# Patient Record
Sex: Male | Born: 1960 | Race: White | Hispanic: No | Marital: Married | Smoking: Never smoker
Health system: Southern US, Community
[De-identification: ages and names within clinical notes are randomized; demographics above are authoritative.]

## PROBLEM LIST (undated history)

## (undated) DIAGNOSIS — N200 Calculus of kidney: Secondary | ICD-10-CM

## (undated) HISTORY — PX: LITHOTRIPSY: SUR834

## (undated) HISTORY — PX: CYSTOSCOPY/RETROGRADE/URETEROSCOPY/STONE EXTRACTION WITH BASKET: SHX5317

---

## 2007-08-01 ENCOUNTER — Emergency Department (HOSPITAL_COMMUNITY): Admission: EM | Admit: 2007-08-01 | Discharge: 2007-08-01 | Payer: Self-pay | Admitting: Emergency Medicine

## 2007-08-05 ENCOUNTER — Ambulatory Visit: Payer: Self-pay | Admitting: Urology

## 2008-07-22 ENCOUNTER — Emergency Department: Payer: Self-pay | Admitting: Emergency Medicine

## 2011-10-06 LAB — POCT URINALYSIS DIP (DEVICE)
Bilirubin Urine: NEGATIVE
Glucose, UA: NEGATIVE
Nitrite: NEGATIVE
Operator id: 235561
Specific Gravity, Urine: >=1.03

## 2011-10-06 LAB — URINE CULTURE: Colony Count: 1000

## 2014-09-05 ENCOUNTER — Emergency Department (INDEPENDENT_AMBULATORY_CARE_PROVIDER_SITE_OTHER)
Admission: EM | Admit: 2014-09-05 | Discharge: 2014-09-05 | Disposition: A | Discharge status: Home / Self Care | Payer: Commercial Managed Care - PPO | Source: Home / Self Care | Attending: Family Medicine | Admitting: Family Medicine

## 2014-09-05 ENCOUNTER — Encounter (HOSPITAL_COMMUNITY): Payer: Self-pay | Admitting: Emergency Medicine

## 2014-09-05 DIAGNOSIS — N200 Calculus of kidney: Secondary | ICD-10-CM

## 2014-09-05 DIAGNOSIS — R7989 Other specified abnormal findings of blood chemistry: Secondary | ICD-10-CM

## 2014-09-05 DIAGNOSIS — R799 Abnormal finding of blood chemistry, unspecified: Secondary | ICD-10-CM

## 2014-09-05 HISTORY — DX: Calculus of kidney: N20.0

## 2014-09-05 LAB — POCT I-STAT, CHEM 8
BUN: 16 mg/dL (ref 6–23)
CHLORIDE: 106 meq/L (ref 96–112)
Calcium, Ion: 1.36 mmol/L — ABNORMAL HIGH (ref 1.12–1.23)
Creatinine, Ser: 1.4 mg/dL — ABNORMAL HIGH (ref 0.50–1.35)
Glucose, Bld: 145 mg/dL — ABNORMAL HIGH (ref 70–99)
HCT: 49 % (ref 39.0–52.0)
Hemoglobin: 16.7 g/dL (ref 13.0–17.0)
Potassium: 4.9 mEq/L (ref 3.7–5.3)
SODIUM: 140 meq/L (ref 137–147)
TCO2: 27 mmol/L (ref 0–100)

## 2014-09-05 LAB — POCT URINALYSIS DIP (DEVICE)
BILIRUBIN URINE: NEGATIVE
Glucose, UA: NEGATIVE mg/dL
KETONES UR: NEGATIVE mg/dL
LEUKOCYTES UA: NEGATIVE
Nitrite: NEGATIVE
PH: 5 (ref 5.0–8.0)
Protein, ur: NEGATIVE mg/dL
Specific Gravity, Urine: >=1.03 (ref 1.005–1.030)
Urobilinogen, UA: 0.2 mg/dL (ref 0.0–1.0)

## 2014-09-05 MED ORDER — HYDROMORPHONE HCL 1 MG/ML IJ SOLN
2.0000 mg | Freq: Once | INTRAMUSCULAR | Status: AC
  Administered 2014-09-05: 2 mg via INTRAMUSCULAR

## 2014-09-05 MED ORDER — ONDANSETRON 4 MG PO TBDP
ORAL_TABLET | ORAL | Status: AC
  Filled 2014-09-05: qty 1

## 2014-09-05 MED ORDER — KETOROLAC TROMETHAMINE 60 MG/2ML IM SOLN
INTRAMUSCULAR | Status: AC
  Filled 2014-09-05: qty 2

## 2014-09-05 MED ORDER — IBUPROFEN 800 MG PO TABS: 800.0000 mg | ORAL_TABLET | Freq: Three times a day (TID) | ORAL | Status: DC

## 2014-09-05 MED ORDER — OXYCODONE-ACETAMINOPHEN 5-325 MG PO TABS: 1.0000 | ORAL_TABLET | ORAL | Status: DC | PRN

## 2014-09-05 MED ORDER — HYDROMORPHONE HCL PF 1 MG/ML IJ SOLN
INTRAMUSCULAR | Status: AC
  Filled 2014-09-05: qty 2

## 2014-09-05 MED ORDER — KETOROLAC TROMETHAMINE 60 MG/2ML IM SOLN
60.0000 mg | Freq: Once | INTRAMUSCULAR | Status: AC
  Administered 2014-09-05: 60 mg via INTRAMUSCULAR

## 2014-09-05 MED ORDER — TAMSULOSIN HCL 0.4 MG PO CAPS: 0.4000 mg | ORAL_CAPSULE | Freq: Every day | ORAL | Status: DC

## 2014-09-05 MED ORDER — ONDANSETRON 4 MG PO TBDP
4.0000 mg | ORAL_TABLET | Freq: Once | ORAL | Status: AC
  Administered 2014-09-05: 4 mg via ORAL

## 2014-09-05 NOTE — Discharge Instructions (Signed)

## 2014-09-05 NOTE — ED Provider Notes (Signed)
CSN: [id number]     Arrival date & time 09/05/14  0945 History   None    Chief Complaint  Patient presents with  . Flank Pain   (Consider location/radiation/quality/duration/timing/severity/associated sxs/prior Treatment) HPI Comments: 53 year old male presents complaining of probable kidney stone. He has a history of kidney stones 5 years ago, and he underwent lithotripsy and passed multiple stones over the next few weeks, and has not had any issues since then. He is still under the care of a urologist, he had a vasectomy couple of years ago. The pain is in his left flank and radiates into his left testicle. It began acutely at 6:00 this morning. The pain is severe and comes in waves, 10 out of 10 in severity. He tried drinking a beer and taking Flomax, that has not helped. he has not had any vomiting. No pain medications taken. He is requesting something for pain at this time. He had gross hematuria this morning as well.   Past Medical History  Diagnosis Date  . Kidney stone    History reviewed. No pertinent past surgical history. History reviewed. No pertinent family history. History  Substance Use Topics  . Smoking status: Never Smoker   . Smokeless tobacco: Not on file  . Alcohol Use: Yes    Review of Systems  Constitutional: Negative for fever and chills.  Gastrointestinal: Negative for nausea, vomiting and abdominal pain.  Genitourinary: Positive for hematuria, flank pain and testicular pain. Negative for discharge and penile pain.  All other systems reviewed and are negative.   Allergies  Review of patient's allergies indicates no known allergies.  Home Medications   Prior to Admission medications   Medication Sig Start Date End Date Taking? Authorizing Provider  ibuprofen (ADVIL,MOTRIN) 800 MG tablet Take 1 tablet (800 mg total) by mouth 3 (three) times daily. 09/05/14   Graylon Good, PA-C  oxyCODONE-acetaminophen (PERCOCET/ROXICET) 5-325 MG per tablet Take 1-2  tablets by mouth every 4 (four) hours as needed for moderate pain or severe pain. 09/05/14   Graylon Good, PA-C  tamsulosin (FLOMAX) 0.4 MG CAPS capsule Take 1 capsule (0.4 mg total) by mouth daily. 09/05/14   Adrian Blackwater Kaleigha Chamberlin, PA-C   BP 152/90  Pulse 78  Temp(Src) 98.6 F (37 C) (Oral)  Resp 18  SpO2 100% Physical Exam  Nursing note and vitals reviewed. Constitutional: He is oriented to person, place, and time. He appears well-developed and well-nourished. He appears distressed (in obvious pain ).  HENT:  Head: Normocephalic.  Pulmonary/Chest: Effort normal. No respiratory distress.  Abdominal: He exhibits no distension and no mass. There is no tenderness. There is CVA tenderness (left). There is no rebound and no guarding.  Neurological: He is alert and oriented to person, place, and time. Coordination normal.  Skin: Skin is warm and dry. No rash noted. He is not diaphoretic.  Psychiatric: He has a normal mood and affect. Judgment normal.    ED Course  Procedures (including critical care time) Labs Review Labs Reviewed  POCT URINALYSIS DIP (DEVICE) - Abnormal; Notable for the following:    Hgb urine dipstick LARGE (*)    All other components within normal limits  POCT I-STAT, CHEM 8 - Abnormal; Notable for the following:    Creatinine, Ser 1.40 (*)    Glucose, Bld 145 (*)    Calcium, Ion 1.36 (*)    All other components within normal limits    Imaging Review No results found.   MDM   1.  Nephrolithiasis   2. Creatinine elevation    Gross hematuria and classic story for kidney stones a patient with a history of kidney stones. Complete symptomatically relief with Toradol and Dilaudid. His mild elevation in serum creatinine is concerning, I have called his urologist office, they have requested faxed records and they will see this patient in the next day or 2. He will go to the emergency department if worsening.   Meds ordered this encounter  Medications  . ketorolac  (TORADOL) injection 60 mg    Sig:   . ondansetron (ZOFRAN-ODT) disintegrating tablet 4 mg    Sig:   . HYDROmorphone (DILAUDID) injection 2 mg    Sig:   . oxyCODONE-acetaminophen (PERCOCET/ROXICET) 5-325 MG per tablet    Sig: Take 1-2 tablets by mouth every 4 (four) hours as needed for moderate pain or severe pain.    Dispense:  20 tablet    Refill:  0    Order Specific Question:  Supervising Provider    Answer:  Linna Hoff [phone]  . tamsulosin (FLOMAX) 0.4 MG CAPS capsule    Sig: Take 1 capsule (0.4 mg total) by mouth daily.    Dispense:  10 capsule    Refill:  0    Order Specific Question:  Supervising Provider    Answer:  Linna Hoff [phone]  . ibuprofen (ADVIL,MOTRIN) 800 MG tablet    Sig: Take 1 tablet (800 mg total) by mouth 3 (three) times daily.    Dispense:  30 tablet    Refill:  0    Order Specific Question:  Supervising Provider    Answer:  Bradd Canary D [[id number]]      Graylon Good, PA-C 09/05/14 1050

## 2014-09-05 NOTE — ED Notes (Signed)
Pt reports  l  Flank  Pain    With  Pain  Radiating  Downward     With some  Nausea   As  Well     Symptoms  Started  This am       History of kidney  Stones  In  Past

## 2014-09-08 NOTE — ED Provider Notes (Signed)
Medical screening examination/treatment/procedure(s) were performed by resident physician or non-physician practitioner and as supervising physician I was immediately available for consultation/collaboration.   Nikya Busler DOUGLAS MD.   Deven Furia D Malick Netz, MD 09/08/14 1643 

## 2014-09-12 ENCOUNTER — Ambulatory Visit: Payer: Self-pay | Admitting: Urology

## 2015-04-14 NOTE — Op Note (Signed)
PATIENT NAME:  Jared Bell, Jared Bell MR#:  [id number] DATE OF BIRTH:  1961/10/06  DATE OF PROCEDURE:  09/12/2014  PREOPERATIVE DIAGNOSIS: Left ureterolithiasis.   POSTOPERATIVE DIAGNOSIS: Left ureterolithiasis.   PROCEDURES:  1.  Left ureteroscopic ureterolithotomy with holmium laser lithotripsy.  2.  Fluoroscopy.   SURGEON: Anola GurneyMichael Roger Kettles, MD.   ANESTHETIST:  Dr. Darleene CleaverVan Staveren.    ANESTHETIC METHOD: General.   INDICATIONS: See the dictated physical. After informed consent the patient requests the above procedure.   OPERATIVE SUMMARY: After adequate general anesthesia had been obtained the patient was placed into dorsal lithotomy position and the perineum was prepped and draped in the usual fashion. Fluoroscopy confirmed presence of a 5 mm distal left ureteral stone. At this point the 7521 French cystoscope was coupled with the camera and then visually advanced into the bladder. The bladder was thoroughly inspected. No bladder tumors were identified. A 0.035 Glidewire was then advanced up the left ureter beyond the stone and curled into the renal pelvis. A 7 mm balloon dilating catheter was placed over the guidewire and the intramural ureter was dilated. The dilating catheter was then deflated and removed. Guidewire was left in place. The Stortz mini ureteroscope was then coupled to the camera and advanced into the distal left ureter. The stone was encountered. The 365 micron holmium laser fiber was then advanced through the scope and the stone was engaged. The stone was then pulverized into fragments less than 0.5 mm. At this point the ureteroscope and guidewire were removed. 10 mL of viscous Xylocaine was instilled within the urethra and the bladder. A B and O suppository was placed. The procedure was then terminated and the patient was transferred to the recovery room in stable condition.     ____________________________ Suszanne ConnersMichael R. Evelene CroonWolff, MD [email or web address] D: 09/12/2014 15:02:07 ET T: 09/12/2014 19:01:31  ET JOB#: [id number]  cc: Suszanne ConnersMichael R. Evelene CroonWolff, MD, <Dictator> Orson ApeMICHAEL R Tamaiya Bump MD ELECTRONICALLY SIGNED 09/13/2014 8:48

## 2018-09-26 ENCOUNTER — Ambulatory Visit (INDEPENDENT_AMBULATORY_CARE_PROVIDER_SITE_OTHER): Payer: Commercial Managed Care - PPO

## 2018-09-26 ENCOUNTER — Ambulatory Visit (HOSPITAL_COMMUNITY)
Admission: EM | Admit: 2018-09-26 | Discharge: 2018-09-26 | Disposition: A | Discharge status: Home / Self Care | Payer: Commercial Managed Care - PPO | Attending: Family Medicine | Admitting: Family Medicine

## 2018-09-26 ENCOUNTER — Encounter (HOSPITAL_COMMUNITY): Payer: Self-pay | Admitting: Emergency Medicine

## 2018-09-26 DIAGNOSIS — S51812A Laceration without foreign body of left forearm, initial encounter: Secondary | ICD-10-CM | POA: Diagnosis not present

## 2018-09-26 DIAGNOSIS — W293XXA Contact with powered garden and outdoor hand tools and machinery, initial encounter: Secondary | ICD-10-CM

## 2018-09-26 DIAGNOSIS — S51811A Laceration without foreign body of right forearm, initial encounter: Secondary | ICD-10-CM

## 2018-09-26 MED ORDER — TETANUS-DIPHTH-ACELL PERTUSSIS 5-2.5-18.5 LF-MCG/0.5 IM SUSP
0.5000 mL | Freq: Once | INTRAMUSCULAR | Status: AC
  Administered 2018-09-26: 0.5 mL via INTRAMUSCULAR

## 2018-09-26 MED ORDER — TETANUS-DIPHTH-ACELL PERTUSSIS 5-2.5-18.5 LF-MCG/0.5 IM SUSP
INTRAMUSCULAR | Status: AC
  Filled 2018-09-26: qty 0.5

## 2018-09-26 MED ORDER — OXYCODONE-ACETAMINOPHEN 5-325 MG PO TABS: 1.0000 | ORAL_TABLET | ORAL | 0 refills | Status: DC | PRN

## 2018-09-26 MED ORDER — HYDROMORPHONE HCL 1 MG/ML IJ SOLN
INTRAMUSCULAR | Status: AC
  Filled 2018-09-26: qty 0.5

## 2018-09-26 MED ORDER — HYDROMORPHONE HCL 1 MG/ML IJ SOLN
0.5000 mg | Freq: Once | INTRAMUSCULAR | Status: AC
  Administered 2018-09-26: 0.5 mg via SUBCUTANEOUS

## 2018-09-26 MED ORDER — DOXYCYCLINE HYCLATE 100 MG PO CAPS: 100.0000 mg | ORAL_CAPSULE | Freq: Two times a day (BID) | ORAL | 0 refills | Status: DC

## 2018-09-26 MED ORDER — OXYCODONE-ACETAMINOPHEN 5-325 MG PO TABS: 1.0000 | ORAL_TABLET | Freq: Four times a day (QID) | ORAL | 0 refills | Status: DC | PRN

## 2018-09-26 NOTE — Discharge Instructions (Addendum)
Start antibiotic therapy with doxycycline 100 mg twice daily within the next 24 hours. I have prescribed you Percocet 1 tablet every 6 hours as needed for pain. Return for care here in 5 days for a follow-up wound evaluation.  And return in 14 days to have sutures removed.  You may schedule an appointment online to avoid a wait.  Your x-ray was negative of any foreign bodies or fractures.  Go to the ER if you experience any numbness, tingling, diminished sensation or weakness of the right arm.  Schedule a new patient appointment I have listed contact information for Primary care at [facility] tomorrow 09/27/2018.

## 2018-09-26 NOTE — ED Notes (Signed)
Pt complains of numbness in his fingers. Kim NP at bedside. Assessed patient and states everything is okay and the patient is okay to go.

## 2018-09-26 NOTE — ED Triage Notes (Signed)
Pt with laceration to left forearm with chainsaw; CMS intact and bleeding controlled

## 2018-09-26 NOTE — ED Provider Notes (Signed)
[facility]    CSN: [id number] Arrival date & time: 09/26/18  1455     History   Chief Complaint Chief Complaint  Patient presents with  . Laceration    HPI Jared Bell is a 57 y.o. male.   HPI  Patient presents today approximately 1 hour after suffering a laceration after inadvertently dropping his chainsaw on his right arm. He was attempting to trim tree limbs and when this incident occurred. He reports minimal blood loss and denies numbness, tingling, or loss of sensation in his right arm or fingers.  He came immediately to urgent care for evaluation. He is not taking aspirin or blood thinners. He is accompanied by family today. He reports 10/10 pain. He is alert, oriented, and ambulatory. Past Medical History:  Diagnosis Date  . Kidney stone     There are no active problems to display for this patient.  History reviewed. No pertinent surgical history.  Home Medications    Prior to Admission medications   Medication Sig Start Date End Date Taking? Authorizing Provider  ibuprofen (ADVIL,MOTRIN) 800 MG tablet Take 1 tablet (800 mg total) by mouth 3 (three) times daily. 09/05/14   Graylon Good, PA-C  oxyCODONE-acetaminophen (PERCOCET/ROXICET) 5-325 MG per tablet Take 1-2 tablets by mouth every 4 (four) hours as needed for moderate pain or severe pain. Patient not taking: Reported on 09/26/2018 09/05/14   Graylon Good, PA-C  tamsulosin (FLOMAX) 0.4 MG CAPS capsule Take 1 capsule (0.4 mg total) by mouth daily. 09/05/14   Graylon Good, PA-C    Family History History reviewed. No pertinent family history.  Social History Social History   Tobacco Use  . Smoking status: Never Smoker  Substance Use Topics  . Alcohol use: Yes  . Drug use: Not on file     Allergies   Patient has no known allergies.   Review of Systems Review of Systems Pertinent negatives listed in HPI Physical Exam Triage Vital Signs ED Triage Vitals [09/26/18 1515]    Enc Vitals Group     BP (!) 161/92     Pulse Rate 72     Resp 18     Temp 98.5 F (36.9 C)     Temp Source Oral     SpO2 99 %     Weight      Height      Head Circumference      Peak Flow      Pain Score 10     Pain Loc      Pain Edu?      Excl. in GC?    No data found.  Updated Vital Signs BP (!) 161/92 (BP Location: Right Arm)   Pulse 72   Temp 98.5 F (36.9 C) (Oral)   Resp 18   SpO2 99%   Visual Acuity Right Eye Distance:   Left Eye Distance:   Bilateral Distance:    Right Eye Near:   Left Eye Near:    Bilateral Near:     Physical Exam  Constitutional: He is oriented to person, place, and time.  Cardiovascular: Normal rate.  Pulmonary/Chest: Effort normal.  Neurological: He is alert and oriented to person, place, and time. No sensory deficit. Coordination normal.  Bilateral hand grips 5/5  Skin: Skin is warm. Capillary refill takes less than 2 seconds.              UC Treatments / Results  Labs (all labs ordered are listed,  but only abnormal results are displayed) Labs Reviewed - No data to display  EKG None  Radiology Dg Forearm Left  Result Date: 09/26/2018 CLINICAL DATA:  LEFT forearm laceration with a chainsaw today EXAM: LEFT FOREARM - 2 VIEW COMPARISON:  None FINDINGS: Dressing artifacts at ulnar and volar aspect of mid forearm. Osseous mineralization normal. Joint spaces preserved. No acute fracture, dislocation, or bone destruction. No radiopaque foreign bodies. IMPRESSION: No acute osseous abnormalities. Electronically Signed   By: Ulyses Southward M.D.   On: 09/26/2018 18:05    Procedures Laceration Repair Date/Time: 09/26/2018 7:22 PM Performed by: Bing Neighbors, FNP Authorized by: Bing Neighbors, FNP   Consent:    Consent obtained:  Verbal   Consent given by:  Patient   Risks discussed:  Poor cosmetic result, poor wound healing, vascular damage, retained foreign body and nerve damage   Alternatives discussed:   Referral Anesthesia (see MAR for exact dosages):    Anesthesia method:  Local infiltration   Local anesthetic:  Lidocaine 2% WITH epi Laceration details:    Location:  Shoulder/arm   Shoulder/arm location:  R lower arm Repair type:    Repair type:  Complex Pre-procedure details:    Preparation:  Patient was prepped and draped in usual sterile fashion and imaging obtained to evaluate for foreign bodies Treatment:    Area cleansed with:  Hibiclens   Amount of cleaning:  Extensive   Irrigation solution:  Sterile water   Irrigation method:  Pressure wash   Visualized foreign bodies/material removed: no   Skin repair:    Repair method:  Sutures   Suture size:  3-0   Suture material:  Prolene   Suture technique:  Simple interrupted   Number of sutures:  5 Approximation:    Approximation:  Loose Post-procedure details:    Dressing:  Bulky dressing   Patient tolerance of procedure:  Tolerated with difficulty Comments:     Patient experience numbness and tingling in distal digits of right hand. Arm was elevated on table for approximately 40 minutes form beginning to end of procedure. He maintains good capillary refill, sensation is intact, strength 5/5. Given option to go now to ER, opted to monitor for a short period longer and if symptoms do not resolve or worsen, will follow-up at the ER for further evaluation.   (including critical care time)  Medications Ordered in UC Medications  HYDROmorphone (DILAUDID) injection 0.5 mg (0.5 mg Subcutaneous Given 09/26/18 1618)    Initial Impression / Assessment and Plan / UC Course  I have reviewed the triage vital signs and the nursing notes.  Pertinent labs & imaging results that were available during my care of the patient were reviewed by me and considered in my medical decision making (see chart for details).  Patient presents today for laceration of the right upper anterior forearm in which she sustained earlier today while cutting  limbs with a chainsaw.  Chainsaw  made contact with his right upper forearm causing a laceration extending to the dermis layer of skin. He sustained minimal blood loss and was able to control pain prior to arrival to urgent care. Pain was managed with Dilaudid 0.5 mg x 1 IM.  Wound irrigated vigorously. Laceration was  not well approximated therefore laceration  closed using simple interrupted, suturing approach.  Patient was given anticipatory guidance that laceration will likely result in a permanent scar forearm.  Tdap vaccination given.  Imaging performed-negative for foreign body or fracture. Prescribed prophylactic antibiotic  to prevent infection and a short course of pain medication for moderate to severe pain.  Patient advised to follow-up here at urgent care in 5 days for wound check and return in 14 days to have sutures removed.  Patient and wife verbalized understanding and agreement with plan.    Post procedure: Patient experience numbness and tingling in distal digits of right hand. Arm was elevated on table for approximately 40 minutes form beginning to end of procedure. He maintains good capillary refill, sensation is intact, strength 5/5. Given option to go now to ER, opted to monitor for a short period longer and if symptoms do not resolve or worsen, will follow-up at the ER for further evaluation.  Final Clinical Impressions(s) / UC Diagnoses   Final diagnoses:  Laceration of right forearm, initial encounter     Discharge Instructions     Start antibiotic therapy with doxycycline 100 mg twice daily within the next 24 hours. I have prescribed you Percocet 1 tablet every 6 hours as needed for pain. Return for care here in 5 days for a follow-up wound evaluation.  And return in 14 days to have sutures removed.  You may schedule an appointment online to avoid a wait.  Your x-ray was negative of any foreign bodies or fractures.  Go to the ER if you experience any numbness, tingling,  diminished sensation or weakness of the right arm.  Schedule a new patient appointment I have listed contact information for Primary care at [facility] tomorrow 09/27/2018.    ED Prescriptions    Medication Sig Dispense Auth. Provider   oxyCODONE-acetaminophen (PERCOCET/ROXICET) 5-325 MG tablet  (Status: Discontinued) Take 1-2 tablets by mouth every 4 (four) hours as needed for moderate pain or severe pain. 20 tablet Bing Neighbors, FNP   doxycycline (VIBRAMYCIN) 100 MG capsule Take 1 capsule (100 mg total) by mouth 2 (two) times daily. 20 capsule Bing Neighbors, FNP   oxyCODONE-acetaminophen (PERCOCET/ROXICET) 5-325 MG tablet Take 1-2 tablets by mouth every 6 (six) hours as needed for moderate pain or severe pain. 20 tablet Bing Neighbors, FNP     Controlled Substance Prescriptions [place] Controlled Substance Registry consulted? Yes, I have consulted the [place] Controlled Substances Registry for this patient, and feel the risk/benefit ratio today is favorable for proceeding with this prescription for a controlled substance.   Bing Neighbors, [place] 09/28/18 [phone]

## 2018-09-27 ENCOUNTER — Telehealth: Payer: Self-pay | Admitting: Family Medicine

## 2018-09-27 NOTE — Telephone Encounter (Signed)
Contact patient to schedule follow-up 10/01/18 or 10/04/2018 for wound evaluation. Pt seen and would like to establish at [facility]

## 2018-10-01 ENCOUNTER — Encounter: Payer: Self-pay | Admitting: Family Medicine

## 2018-10-01 ENCOUNTER — Ambulatory Visit (INDEPENDENT_AMBULATORY_CARE_PROVIDER_SITE_OTHER): Payer: Commercial Managed Care - PPO | Admitting: Family Medicine

## 2018-10-01 VITALS — BP 149/83 | HR 61 | Temp 97.3°F | Resp 17 | Ht 73.0 in | Wt 227.2 lb

## 2018-10-01 DIAGNOSIS — Z5189 Encounter for other specified aftercare: Secondary | ICD-10-CM

## 2018-10-01 DIAGNOSIS — Z23 Encounter for immunization: Secondary | ICD-10-CM | POA: Diagnosis not present

## 2018-10-01 MED ORDER — CEFTRIAXONE SODIUM 500 MG IJ SOLR
500.0000 mg | Freq: Once | INTRAMUSCULAR | Status: AC
  Administered 2018-10-01: 500 mg via INTRAMUSCULAR

## 2018-10-01 NOTE — Progress Notes (Signed)
Patient ID: Natalio Salois, male    DOB: September 04, 1961, 56 y.o.   MRN: [id number]  PCP: Patient, No Pcp Per  Chief Complaint  Patient presents with  . Establish Care  . Suture / Staple Removal    has c/o about redness & puffiness around the sutures. no fever, chills    Subjective:  HPI Gianno Volner is a 57 y.o. male presents to establish care and wound follow-up.Patient was seen by me at [facility] on 09/26/2018. He underwent a left forearm laceration repair s/p accidental laceration caused by chainsaw while trimming tree limbs. He reports resolution of pain and denies any numbness or tingling of distal extremities. He is concerned regarding erythema surround the suture lines. Denies drainage from site, nausea, vomiting, chills, or fever. Wound has remained open to air. He continues oral antibiotics and applies neosporin to wound daily. Social History   Socioeconomic History  . Marital status: Married    Spouse name: Not on file  . Number of children: Not on file  . Years of education: Not on file  . Highest education level: Not on file  Occupational History  . Not on file  Social Needs  . Financial resource strain: Not on file  . Food insecurity:    Worry: Not on file    Inability: Not on file  . Transportation needs:    Medical: Not on file    Non-medical: Not on file  Tobacco Use  . Smoking status: Never Smoker  . Smokeless tobacco: Never Used  Substance and Sexual Activity  . Alcohol use: Yes  . Drug use: Not on file  . Sexual activity: Not on file  Lifestyle  . Physical activity:    Days per week: Not on file    Minutes per session: Not on file  . Stress: Not on file  Relationships  . Social connections:    Talks on phone: Not on file    Gets together: Not on file    Attends religious service: Not on file    Active member of club or organization: Not on file    Attends meetings of clubs or organizations: Not on file    Relationship status:  Not on file  . Intimate partner violence:    Fear of current or ex partner: Not on file    Emotionally abused: Not on file    Physically abused: Not on file    Forced sexual activity: Not on file  Other Topics Concern  . Not on file  Social History Narrative  . Not on file    History reviewed. No pertinent family history.  Review of Systems Pertinent negatives listed in HPI No Known Allergies  Prior to Admission medications   Medication Sig Start Date End Date Taking? Authorizing Provider  doxycycline (VIBRAMYCIN) 100 MG capsule Take 1 capsule (100 mg total) by mouth 2 (two) times daily. 09/26/18  Yes Bing Neighbors, FNP  oxyCODONE-acetaminophen (PERCOCET/ROXICET) 5-325 MG tablet Take 1-2 tablets by mouth every 6 (six) hours as needed for moderate pain or severe pain. 09/26/18  Yes Bing Neighbors, FNP  tamsulosin (FLOMAX) 0.4 MG CAPS capsule Take 1 capsule (0.4 mg total) by mouth daily. 09/05/14  Yes Graylon Good, PA-C   Past Medical, Surgical Family and Social History reviewed and updated.   Objective:   Today's Vitals   10/01/18 1057  BP: (!) 149/83  Pulse: 61  Resp: 17  Temp: (!) 97.3 F (36.3 C)  TempSrc: Oral  SpO2: 96%  Weight: 227 lb 3.2 oz (103.1 kg)  Height: 6\' 1"  (1.854 m)    BP Readings from Last 3 Encounters:  10/01/18 (!) 149/83  09/26/18 (!) 161/92  09/05/14 152/90    Filed Weights   10/01/18 1057  Weight: 227 lb 3.2 oz (103.1 kg)       Physical Exam General appearance: alert, well developed, well nourished, cooperative and in no distress Head: Normocephalic, without obvious abnormality, atraumatic Respiratory: Respirations even and unlabored, normal respiratory rate Heart: rate and rhythm normal. No gallop or murmurs noted on exam  Wound: improved approximation. Erythema present encircling the wound bed. Non-tender. No exudate presence or able to be expressed from wound. Laceration repair appearance 10/01/2018    Laceration  repair 09/26/2018      Assessment & Plan:  1. Visit for wound check, erythema surrounding the wound, concern for possible infection.  Will give IM Rocephin here in office and continue oral antibiotics. Continue to keep wound open to air. Continue to apply topical antibiotic ointment.  Return in 7 days for suture removal  cefTRIAXone (ROCEPHIN) injection 500 mg  2. Need for prophylactic vaccination and inoculation against influenza -influenza administered.   Joaquin Courts, FNP Primary Care at [facility] [address] [place] [address] [phone]: [phone]

## 2018-10-01 NOTE — Patient Instructions (Signed)
Thank you for choosing Primary Care at [facility] for your medical home!    Jared Bell was seen by Joaquin Courts, FNP today.   Jared Bell's primary care doctor is Patient, No Pcp Per.   For the best care possible,  you should try to see Joaquin Courts, FNP  whenever you come to clinic.   We look forward to seeing you again soon!  If you have any questions about your visit today,  please call us at   Or feel free to reach your provider via MyChart.      Influenza (Flu) Vaccine (Inactivated or Recombinant): What You Need to Know 1. Why get vaccinated? Influenza ("flu") is a contagious disease that spreads around the [place] every year, usually between October and May. Flu is caused by influenza viruses, and is spread mainly by coughing, sneezing, and close contact. Anyone can get flu. Flu strikes suddenly and can last several days. Symptoms vary by age, but can include:  fever/chills  sore throat  muscle aches  fatigue  cough  headache  runny or stuffy nose  Flu can also lead to pneumonia and blood infections, and cause diarrhea and seizures in children. If you have a medical condition, such as heart or lung disease, flu can make it worse. Flu is more dangerous for some people. Infants and young children, people 67 years of age and older, pregnant women, and people with certain health conditions or a weakened immune system are at greatest risk. Each year thousands of people in the [place] die from flu, and many more are hospitalized. Flu vaccine can:  keep you from getting flu,  make flu less severe if you do get it, and  keep you from spreading flu to your family and other people. 2. Inactivated and recombinant flu vaccines A dose of flu vaccine is recommended every flu season. Children 6 months through 67 years of age may need two doses during the same flu season. Everyone else needs only one dose each flu season. Some inactivated  flu vaccines contain a very small amount of a mercury-based preservative called thimerosal. Studies have not shown thimerosal in vaccines to be harmful, but flu vaccines that do not contain thimerosal are available. There is no live flu virus in flu shots. They cannot cause the flu. There are many flu viruses, and they are always changing. Each year a new flu vaccine is made to protect against three or four viruses that are likely to cause disease in the upcoming flu season. But even when the vaccine doesn't exactly match these viruses, it may still provide some protection. Flu vaccine cannot prevent:  flu that is caused by a virus not covered by the vaccine, or  illnesses that look like flu but are not.  It takes about 2 weeks for protection to develop after vaccination, and protection lasts through the flu season. 3. Some people should not get this vaccine Tell the person who is giving you the vaccine:  If you have any severe, life-threatening allergies. If you ever had a life-threatening allergic reaction after a dose of flu vaccine, or have a severe allergy to any part of this vaccine, you may be advised not to get vaccinated. Most, but not all, types of flu vaccine contain a small amount of egg protein.  If you ever had Guillain-Barr Syndrome (also called GBS). Some people with a history of GBS should not get this vaccine. This should be discussed with your doctor.  If you are not feeling well. It is usually okay to get flu vaccine when you have a mild illness, but you might be asked to come back when you feel better.  4. Risks of a vaccine reaction With any medicine, including vaccines, there is a chance of reactions. These are usually mild and go away on their own, but serious reactions are also possible. Most people who get a flu shot do not have any problems with it. Minor problems following a flu shot include:  soreness, redness, or swelling where the shot was  given  hoarseness  sore, red or itchy eyes  cough  fever  aches  headache  itching  fatigue  If these problems occur, they usually begin soon after the shot and last 1 or 2 days. More serious problems following a flu shot can include the following:  There may be a small increased risk of Guillain-Barre Syndrome (GBS) after inactivated flu vaccine. This risk has been estimated at 1 or 2 additional cases per million people vaccinated. This is much lower than the risk of severe complications from flu, which can be prevented by flu vaccine.  Young children who get the flu shot along with pneumococcal vaccine (PCV13) and/or DTaP vaccine at the same time might be slightly more likely to have a seizure caused by fever. Ask your doctor for more information. Tell your doctor if a child who is getting flu vaccine has ever had a seizure.  Problems that could happen after any injected vaccine:  People sometimes faint after a medical procedure, including vaccination. Sitting or lying down for about 15 minutes can help prevent fainting, and injuries caused by a fall. Tell your doctor if you feel dizzy, or have vision changes or ringing in the ears.  Some people get severe pain in the shoulder and have difficulty moving the arm where a shot was given. This happens very rarely.  Any medication can cause a severe allergic reaction. Such reactions from a vaccine are very rare, estimated at about 1 in a million doses, and would happen within a few minutes to a few hours after the vaccination. As with any medicine, there is a very remote chance of a vaccine causing a serious injury or death. The safety of vaccines is always being monitored. For more information, visit: http://www.aguilar.org/ 5. What if there is a serious reaction? What should I look for? Look for anything that concerns you, such as signs of a severe allergic reaction, very high fever, or unusual behavior. Signs of a severe  allergic reaction can include hives, swelling of the face and throat, difficulty breathing, a fast heartbeat, dizziness, and weakness. These would start a few minutes to a few hours after the vaccination. What should I do?  If you think it is a severe allergic reaction or other emergency that can't wait, call 9-1-1 and get the person to the nearest hospital. Otherwise, call your doctor.  Reactions should be reported to the Vaccine Adverse Event Reporting System (VAERS). Your doctor should file this report, or you can do it yourself through the VAERS web site at www.vaers.SamedayNews.es, or by calling 463-202-6445. ? VAERS does not give medical advice. 6. The National Vaccine Injury Compensation Program The [employer] Vaccine Injury Compensation Program (VICP) is a federal program that was created to compensate people who may have been injured by certain vaccines. Persons who believe they may have been injured by a vaccine can learn about the program and about filing a claim by  calling 1-(442)295-7131 or visiting the VICP website at SpiritualWord.at. There is a time limit to file a claim for compensation. 7. How can I learn more?  Ask your healthcare provider. He or she can give you the vaccine package insert or suggest other sources of information.  Call your local or state health department.  Contact the Centers for Disease Control and Prevention (CDC): ? Call (517)819-3990 (1-800-CDC-INFO) or ? Visit CDC's website at BiotechRoom.com.cy Vaccine Information Statement, Inactivated Influenza Vaccine (07/28/2014) This information is not intended to replace advice given to you by your health care provider. Make sure you discuss any questions you have with your health care provider. Document Released: 10/02/2006 Document Revised: 08/28/2016 Document Reviewed: 08/28/2016 Elsevier Interactive Patient Education  2017 [employer].

## 2018-10-06 ENCOUNTER — Encounter: Payer: Self-pay | Admitting: Family Medicine

## 2018-10-06 ENCOUNTER — Ambulatory Visit (INDEPENDENT_AMBULATORY_CARE_PROVIDER_SITE_OTHER): Payer: Commercial Managed Care - PPO | Admitting: Family Medicine

## 2018-10-06 VITALS — BP 134/91 | HR 68 | Temp 98.2°F | Resp 16 | Ht 73.0 in | Wt 225.0 lb

## 2018-10-06 DIAGNOSIS — R030 Elevated blood-pressure reading, without diagnosis of hypertension: Secondary | ICD-10-CM

## 2018-10-06 DIAGNOSIS — Z4802 Encounter for removal of sutures: Secondary | ICD-10-CM

## 2018-10-06 NOTE — Progress Notes (Signed)
Patient ID: Jared Bell, male    DOB: [birth year], 57 y.o.   MRN: [id number]  PCP: Bing Neighbors, FNP  Chief Complaint  Patient presents with  . Suture / Staple Removal    left lower forearm    Subjective:  HPI Jared Bell is a 57 y.o. male presents for evaluation suture removal of the left forearm. Johns was seen at [facility] on 09/26/2018 with a laceration to left forearm. He presents today, for suture removal. He denies drainage, fever, bruising, swelling, warmth, or numbness or tingling of left extremities. Social History   Socioeconomic History  . Marital status: Married    Spouse name: Not on file  . Number of children: Not on file  . Years of education: Not on file  . Highest education level: Not on file  Occupational History  . Not on file  Social Needs  . Financial resource strain: Not on file  . Food insecurity:    Worry: Not on file    Inability: Not on file  . Transportation needs:    Medical: Not on file    Non-medical: Not on file  Tobacco Use  . Smoking status: Never Smoker  . Smokeless tobacco: Never Used  Substance and Sexual Activity  . Alcohol use: Yes  . Drug use: Not on file  . Sexual activity: Not on file  Lifestyle  . Physical activity:    Days per week: Not on file    Minutes per session: Not on file  . Stress: Not on file  Relationships  . Social connections:    Talks on phone: Not on file    Gets together: Not on file    Attends religious service: Not on file    Active member of club or organization: Not on file    Attends meetings of clubs or organizations: Not on file    Relationship status: Not on file  . Intimate partner violence:    Fear of current or ex partner: Not on file    Emotionally abused: Not on file    Physically abused: Not on file    Forced sexual activity: Not on file  Other Topics Concern  . Not on file  Social History Narrative  . Not on file   No known family history of cardiovascular  disease, lung disease, or diabetes.  Review of Systems Pertinent negatives listed in HPI No Known Allergies  Prior to Admission medications   Medication Sig Start Date End Date Taking? Authorizing Provider  doxycycline (VIBRAMYCIN) 100 MG capsule Take 1 capsule (100 mg total) by mouth 2 (two) times daily. 09/26/18   Bing Neighbors, FNP  oxyCODONE-acetaminophen (PERCOCET/ROXICET) 5-325 MG tablet Take 1-2 tablets by mouth every 6 (six) hours as needed for moderate pain or severe pain. 09/26/18   Bing Neighbors, FNP  tamsulosin (FLOMAX) 0.4 MG CAPS capsule Take 1 capsule (0.4 mg total) by mouth daily. 09/05/14   Graylon Good, PA-C    Past Medical, Surgical Family and Social History reviewed and updated.    Objective:   Today's Vitals   10/06/18 0933  BP: (!) 134/91  Pulse: 68  Resp: 16  Temp: 98.2 F (36.8 C)  TempSrc: Oral  SpO2: 95%  Weight: 225 lb (102.1 kg)  Height: 6\' 1"  (1.854 m)    Wt Readings from Last 3 Encounters:  10/01/18 227 lb 3.2 oz (103.1 kg)   Physical Exam General appearance: alert, well developed, well nourished, cooperative and  in no distress Head: Normocephalic, without obvious abnormality, atraumatic Respiratory: Respirations even and unlabored, normal respiratory rate Heart: rate and rhythm normal. No gallop or murmurs noted on exam  Extremities: No gross deformities Skin: Left forearm: Removed # 5 suture materials, cleansed and bandage. No drainage noted. Wound is approximated with the exception of area that was not sutured. Skin color, texture, turgor normal. No rashes seen  Psych: Appropriate mood and affect. Neurologic: Mental status: Alert, oriented to person, place, and time, thought content appropriate.   Assessment & Plan:  1. Encounter for removal of sutures, tolerated. No additional follow-up required. Patient understand there will likely be chronic scaring present.  2. Elevated BP without diagnosis of hypertension -Continue to  monitor. Continue routine exercise.  -The patient was given clear instructions to go to ER or return to medical center if symptoms do not improve, worsen or new problems develop. The patient verbalized understanding.    Joaquin Courts, FNP Primary Care at [facility] [address], [place] [address] [phone]: [phone]

## 2018-10-06 NOTE — Patient Instructions (Signed)

## 2019-11-15 IMAGING — DX DG FOREARM 2V*L*
2 series · 2 of 2 positions shown · non-contrast
Comparison: None

CLINICAL DATA: LEFT forearm laceration with a chainsaw today

EXAM:
LEFT FOREARM - 2 VIEW

[forearm ap]
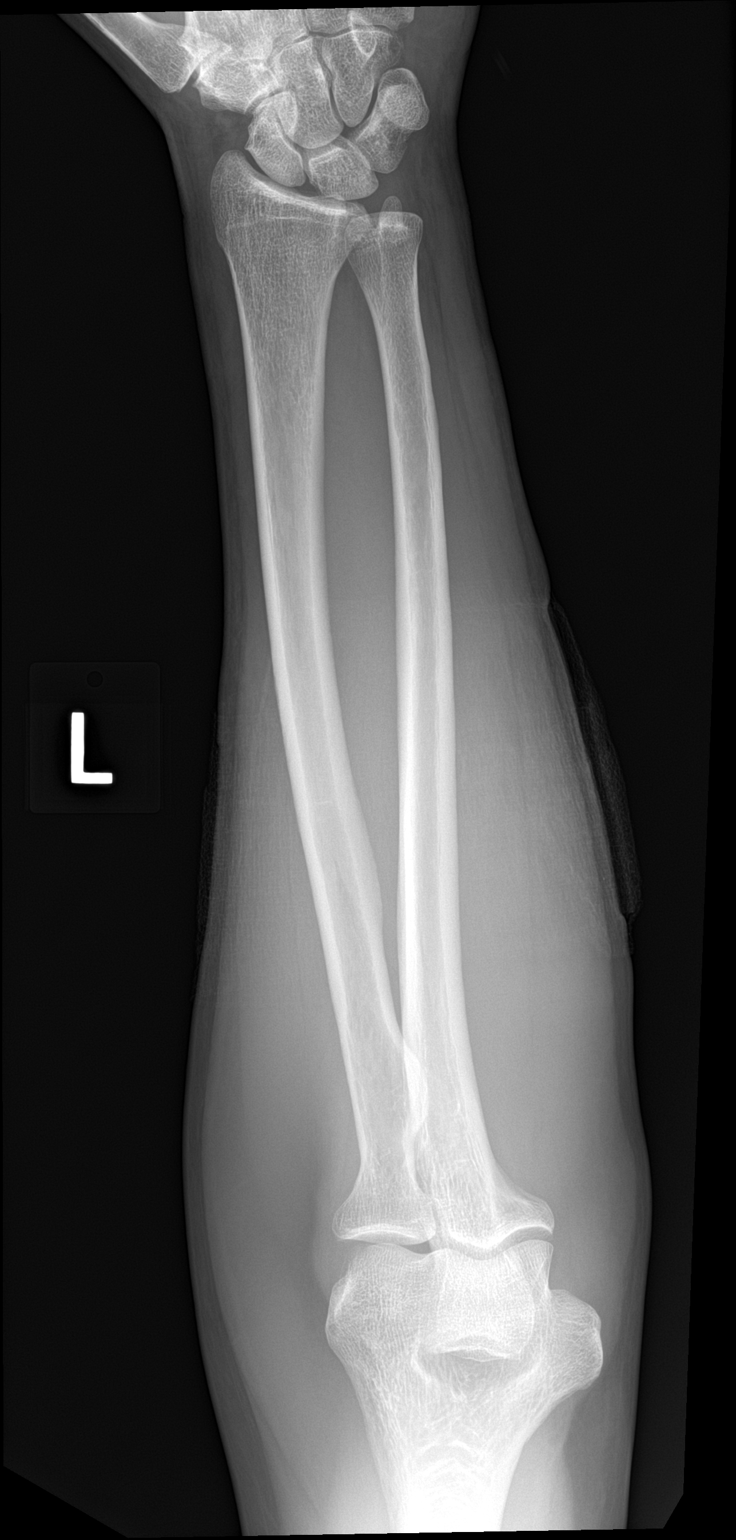

[forearm lat]
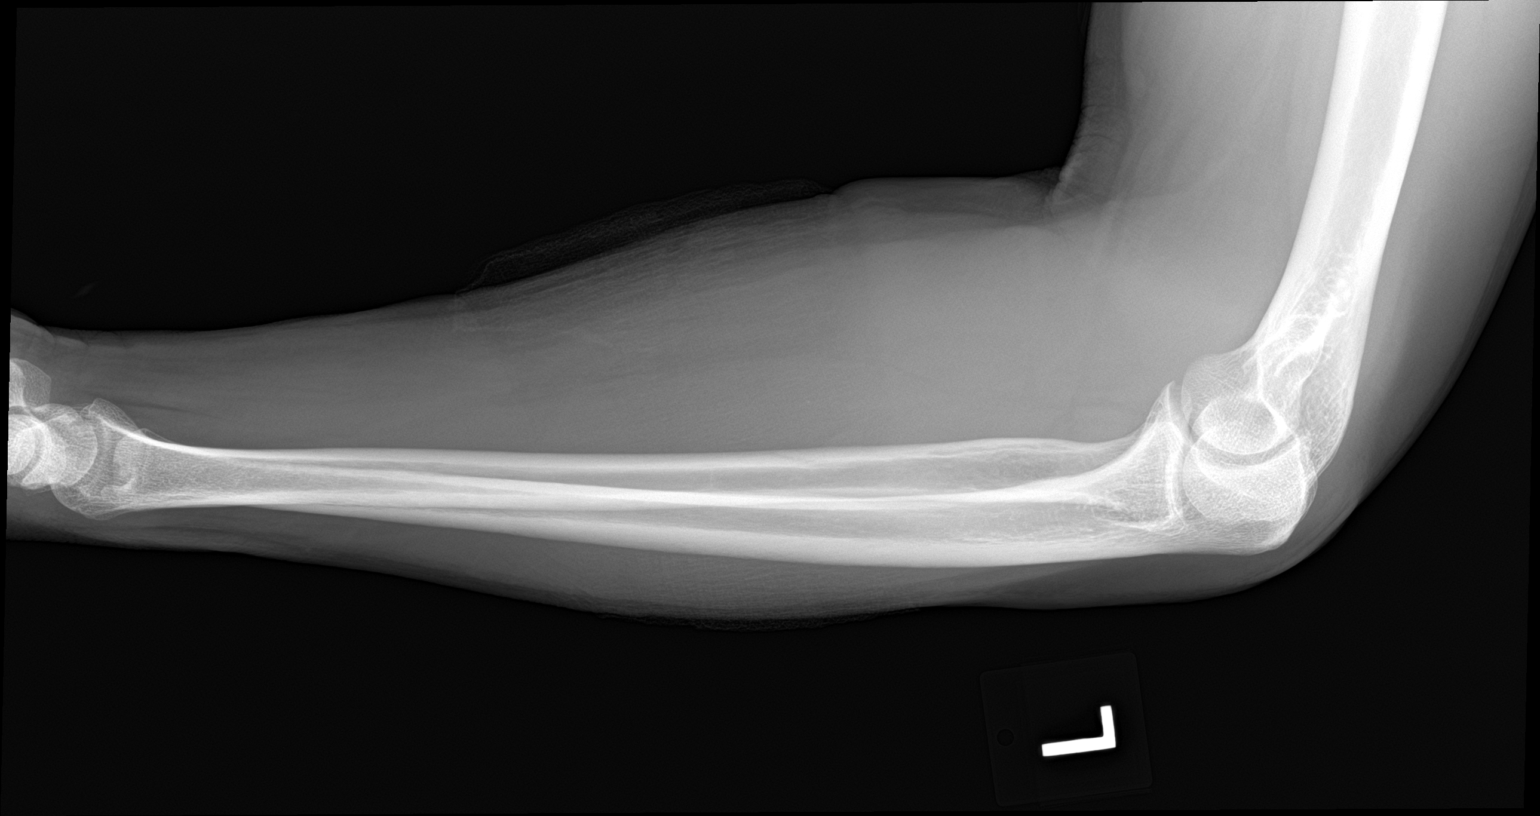

[2 of 2 positions shown; findings below may reference images not displayed]

FINDINGS: Dressing artifacts at ulnar and volar aspect of mid forearm.

Osseous mineralization normal.

Joint spaces preserved.

No acute fracture, dislocation, or bone destruction.

No radiopaque foreign bodies.
IMPRESSION: No acute osseous abnormalities.

## 2020-03-29 ENCOUNTER — Ambulatory Visit: Payer: Commercial Managed Care - PPO | Attending: Internal Medicine

## 2020-03-29 DIAGNOSIS — Z23 Encounter for immunization: Secondary | ICD-10-CM

## 2020-03-29 NOTE — Progress Notes (Signed)
   Covid-19 Vaccination Clinic  Name:  Jared Bell    MRN: [id number] DOB: [birth year]  03/29/2020  [name] was observed post Covid-19 immunization for 15 minutes without incident. He was provided with Vaccine Information Sheet and instruction to access the V-Safe system.   [name] was instructed to call 911 with any severe reactions post vaccine: Marland Kitchen Difficulty breathing  . Swelling of face and throat  . A fast heartbeat  . A bad rash all over body  . Dizziness and weakness   Immunizations Administered    Name Date Dose VIS Date Route   Pfizer COVID-19 Vaccine 03/29/2020  3:58 PM 0.3 mL 12/02/2019 Intramuscular   Manufacturer: [employer], [employer]   Lot: [id number]   NDC: [address]-2

## 2020-04-23 ENCOUNTER — Ambulatory Visit: Payer: Commercial Managed Care - PPO | Attending: Internal Medicine

## 2020-04-23 DIAGNOSIS — Z23 Encounter for immunization: Secondary | ICD-10-CM

## 2020-04-23 NOTE — Progress Notes (Signed)
   Covid-19 Vaccination Clinic  Name:  Jared Bell    MRN: [id number] DOB: 09-14-1961  04/23/2020  Jared Bell was observed post Covid-19 immunization for 15 minutes without incident. He was provided with Vaccine Information Sheet and instruction to access the V-Safe system.   Jared Bell was instructed to call 911 with any severe reactions post vaccine: Marland Kitchen Difficulty breathing  . Swelling of face and throat  . A fast heartbeat  . A bad rash all over body  . Dizziness and weakness   Immunizations Administered    Name Date Dose VIS Date Route   Pfizer COVID-19 Vaccine 04/23/2020 10:19 AM 0.3 mL 02/15/2019 Intramuscular   Manufacturer: [employer], [employer]   Lot: [id number]   NDC: [address]-2

## 2021-11-21 LAB — HM HIV SCREENING LAB: HM HIV Screening: NEGATIVE

## 2021-11-21 LAB — HM HEPATITIS C SCREENING LAB: HM Hepatitis Screen: NEGATIVE

## 2024-07-29 ENCOUNTER — Telehealth: Payer: Self-pay | Admitting: Family Medicine

## 2024-07-29 NOTE — Telephone Encounter (Signed)
 His FriendsSales executive and [employer])  are patients and he would like to establish with Dr Cleatus please advise if he would take him on if not advised we can schedule with another available provider

## 2024-07-29 NOTE — Telephone Encounter (Signed)
 Scheduled for 8.26.25 at 8:30am

## 2024-07-29 NOTE — Telephone Encounter (Signed)
Please set up when possible.  Thanks.

## 2024-07-29 NOTE — Telephone Encounter (Signed)
 Please call and schedule patient to see Dr. Cleatus. Thank you

## 2024-08-16 ENCOUNTER — Ambulatory Visit (INDEPENDENT_AMBULATORY_CARE_PROVIDER_SITE_OTHER): Admitting: Family Medicine

## 2024-08-16 ENCOUNTER — Encounter: Payer: Self-pay | Admitting: Family Medicine

## 2024-08-16 VITALS — BP 128/82 | HR 80 | Temp 97.5°F | Ht 72.36 in | Wt 240.0 lb

## 2024-08-16 DIAGNOSIS — Z0000 Encounter for general adult medical examination without abnormal findings: Secondary | ICD-10-CM

## 2024-08-16 DIAGNOSIS — Z13220 Encounter for screening for lipoid disorders: Secondary | ICD-10-CM | POA: Diagnosis not present

## 2024-08-16 DIAGNOSIS — Z1211 Encounter for screening for malignant neoplasm of colon: Secondary | ICD-10-CM

## 2024-08-16 DIAGNOSIS — Z87442 Personal history of urinary calculi: Secondary | ICD-10-CM | POA: Diagnosis not present

## 2024-08-16 DIAGNOSIS — N529 Male erectile dysfunction, unspecified: Secondary | ICD-10-CM

## 2024-08-16 DIAGNOSIS — Z125 Encounter for screening for malignant neoplasm of prostate: Secondary | ICD-10-CM | POA: Diagnosis not present

## 2024-08-16 DIAGNOSIS — Z7189 Other specified counseling: Secondary | ICD-10-CM

## 2024-08-16 LAB — PSA: PSA: 7.51 ng/mL — ABNORMAL HIGH (ref 0.10–4.00)

## 2024-08-16 LAB — LIPID PANEL
Cholesterol: 209 mg/dL — ABNORMAL HIGH (ref 0–200)
HDL: 37.6 mg/dL — ABNORMAL LOW (ref >39.00)
LDL Cholesterol: 133 mg/dL — ABNORMAL HIGH (ref 0–99)
NonHDL: 171.21
Total CHOL/HDL Ratio: 6
Triglycerides: 193 mg/dL — ABNORMAL HIGH (ref 0.0–149.0)
VLDL: 38.6 mg/dL (ref 0.0–40.0)

## 2024-08-16 LAB — COMPREHENSIVE METABOLIC PANEL WITH GFR
ALT: 31 U/L (ref 0–53)
AST: 26 U/L (ref 0–37)
Albumin: 4.8 g/dL (ref 3.5–5.2)
Alkaline Phosphatase: 78 U/L (ref 39–117)
BUN: 24 mg/dL — ABNORMAL HIGH (ref 6–23)
CO2: 28 meq/L (ref 19–32)
Calcium: 9.4 mg/dL (ref 8.4–10.5)
Chloride: 103 meq/L (ref 96–112)
Creatinine, Ser: 1.24 mg/dL (ref 0.40–1.50)
GFR: 62.87 mL/min (ref >60.00)
Glucose, Bld: 82 mg/dL (ref 70–99)
Potassium: 4.6 meq/L (ref 3.5–5.1)
Sodium: 141 meq/L (ref 135–145)
Total Bilirubin: 0.6 mg/dL (ref 0.2–1.2)
Total Protein: 7.4 g/dL (ref 6.0–8.3)

## 2024-08-16 LAB — CBC WITH DIFFERENTIAL/PLATELET
Basophils Absolute: 0 K/uL (ref 0.0–0.1)
Basophils Relative: 0.5 % (ref 0.0–3.0)
Eosinophils Absolute: 0.1 K/uL (ref 0.0–0.7)
Eosinophils Relative: 1.6 % (ref 0.0–5.0)
HCT: 47.4 % (ref 39.0–52.0)
Hemoglobin: 16.4 g/dL (ref 13.0–17.0)
Lymphocytes Relative: 41.5 % (ref 12.0–46.0)
Lymphs Abs: 1.9 K/uL (ref 0.7–4.0)
MCHC: 34.5 g/dL (ref 30.0–36.0)
MCV: 90.6 fl (ref 78.0–100.0)
Monocytes Absolute: 0.4 K/uL (ref 0.1–1.0)
Monocytes Relative: 8.1 % (ref 3.0–12.0)
Neutro Abs: 2.2 K/uL (ref 1.4–7.7)
Neutrophils Relative %: 48.3 % (ref 43.0–77.0)
Platelets: 232 K/uL (ref 150.0–400.0)
RBC: 5.23 Mil/uL (ref 4.22–5.81)
RDW: 13.3 % (ref 11.5–15.5)
WBC: 4.6 K/uL (ref 4.0–10.5)

## 2024-08-16 NOTE — Patient Instructions (Signed)
Go to the lab on the way out.   If you have mychart we'll likely use that to update you.    Take care.  Glad to see you. I would get a flu shot each fall.   

## 2024-08-16 NOTE — Progress Notes (Unsigned)
CPE- See plan.  Routine anticipatory guidance given to patient.  See health maintenance.  The possibility exists that previously documented standard health maintenance information may have been brought forward from a previous encounter into this note.  If needed, that same information has been updated to reflect the current situation based on today's encounter.    PMH and SH reviewed  Meds, vitals, and allergies reviewed.   ROS: Per HPI.  Unless specifically indicated otherwise in HPI, the patient denies:  General: fever. Eyes: acute vision changes ENT: sore throat Cardiovascular: chest pain Respiratory: SOB GI: vomiting GU: dysuria Musculoskeletal: acute back pain Derm: acute rash Neuro: acute motor dysfunction Psych: worsening mood Endocrine: polydipsia Heme: bleeding Allergy: hayfever  GEN: nad, alert and oriented HEENT: mucous membranes moist NECK: supple w/o LA CV: rrr. PULM: ctab, no inc wob ABD: soft, +bs EXT: no edema SKIN: no acute rash

## 2024-08-17 ENCOUNTER — Ambulatory Visit: Payer: Self-pay | Admitting: Family Medicine

## 2024-08-17 ENCOUNTER — Encounter: Payer: Self-pay | Admitting: Family Medicine

## 2024-08-17 ENCOUNTER — Other Ambulatory Visit: Payer: Self-pay | Admitting: Family Medicine

## 2024-08-17 DIAGNOSIS — N529 Male erectile dysfunction, unspecified: Secondary | ICD-10-CM | POA: Insufficient documentation

## 2024-08-17 DIAGNOSIS — Z0000 Encounter for general adult medical examination without abnormal findings: Secondary | ICD-10-CM | POA: Insufficient documentation

## 2024-08-17 DIAGNOSIS — R972 Elevated prostate specific antigen [PSA]: Secondary | ICD-10-CM

## 2024-08-17 DIAGNOSIS — Z7189 Other specified counseling: Secondary | ICD-10-CM | POA: Insufficient documentation

## 2024-08-17 NOTE — Assessment & Plan Note (Signed)
 Wife designated if patient were incapacitated, then son if needed.

## 2024-08-17 NOTE — Assessment & Plan Note (Signed)
 Tetanus 2019 COVID-vaccine previously done. Flu 2024.  He can get that done later in 2025. Pneumonia shot discussed with patient. Shingles shot discussed with patient. RSV not due. D/w patient [id number] for colon cancer screening, including IFOB vs. colonoscopy.  Risks and benefits of both were discussed and patient voiced understanding.  Pt elects for: Cologuard Prostate cancer screening and PSA options (with potential risks and benefits of testing vs not testing) were discussed along with recent recs/guidelines.  He opted for testing PSA at this point. Wife designated if patient were incapacitated, then son if needed. Hepatitis C and HIV screening done at blood donation in December 2022.

## 2024-08-17 NOTE — Assessment & Plan Note (Signed)
 No treatment yet.  Check labs.  He can update me as needed.

## 2024-08-28 LAB — COLOGUARD: COLOGUARD: NEGATIVE

## 2024-08-31 NOTE — Telephone Encounter (Signed)
 Please check with referrals and have them contact/update the patient about scheduling.  Thanks.

## 2024-09-02 NOTE — Telephone Encounter (Signed)
Checking on the status of this referral

## 2024-09-22 ENCOUNTER — Encounter: Payer: Self-pay | Admitting: *Deleted

## 2024-11-30 ENCOUNTER — Other Ambulatory Visit: Payer: Self-pay | Admitting: Urology

## 2024-11-30 DIAGNOSIS — R972 Elevated prostate specific antigen [PSA]: Secondary | ICD-10-CM

## 2024-12-19 ENCOUNTER — Ambulatory Visit
Admission: RE | Admit: 2024-12-19 | Discharge: 2024-12-19 | Disposition: A | Source: Ambulatory Visit | Attending: Urology

## 2024-12-19 DIAGNOSIS — R972 Elevated prostate specific antigen [PSA]: Secondary | ICD-10-CM

## 2024-12-19 MED ORDER — GADOPICLENOL 0.5 MMOL/ML IV SOLN
10.0000 mL | Freq: Once | INTRAVENOUS | Status: AC | PRN
  Administered 2024-12-19: 10 mL via INTRAVENOUS

## 2025-01-10 ENCOUNTER — Other Ambulatory Visit (HOSPITAL_COMMUNITY): Payer: Self-pay | Admitting: Urology

## 2025-01-10 ENCOUNTER — Other Ambulatory Visit: Payer: Self-pay | Admitting: Urology

## 2025-01-10 DIAGNOSIS — N200 Calculus of kidney: Secondary | ICD-10-CM

## 2025-03-02 ENCOUNTER — Ambulatory Visit (HOSPITAL_COMMUNITY): Admit: 2025-03-02 | Admitting: Urology

## 2025-03-02 ENCOUNTER — Ambulatory Visit (HOSPITAL_COMMUNITY)

## 2025-03-02 ENCOUNTER — Other Ambulatory Visit (HOSPITAL_COMMUNITY)

## 2025-03-02 SURGERY — NEPHROLITHOTOMY
Anesthesia: General | Laterality: Left
